# Patient Record
Sex: Male | Born: 1995 | Race: Black or African American | Hispanic: No | Marital: Single | Smoking: Never smoker
Health system: Southern US, Community
[De-identification: ages and names within clinical notes are randomized; demographics above are authoritative.]

---

## 2016-12-03 ENCOUNTER — Emergency Department (HOSPITAL_COMMUNITY): Payer: Medicaid - Out of State

## 2016-12-03 ENCOUNTER — Emergency Department (HOSPITAL_COMMUNITY)
Admission: EM | Admit: 2016-12-03 | Discharge: 2016-12-03 | Disposition: A | Discharge status: Home / Self Care | Payer: Medicaid - Out of State | Attending: Emergency Medicine | Admitting: Emergency Medicine

## 2016-12-03 ENCOUNTER — Encounter (HOSPITAL_COMMUNITY): Payer: Self-pay | Admitting: Neurology

## 2016-12-03 DIAGNOSIS — W109XXA Fall (on) (from) unspecified stairs and steps, initial encounter: Secondary | ICD-10-CM | POA: Insufficient documentation

## 2016-12-03 DIAGNOSIS — Y999 Unspecified external cause status: Secondary | ICD-10-CM | POA: Diagnosis not present

## 2016-12-03 DIAGNOSIS — S6992XA Unspecified injury of left wrist, hand and finger(s), initial encounter: Secondary | ICD-10-CM | POA: Diagnosis present

## 2016-12-03 DIAGNOSIS — Y939 Activity, unspecified: Secondary | ICD-10-CM | POA: Insufficient documentation

## 2016-12-03 DIAGNOSIS — Y929 Unspecified place or not applicable: Secondary | ICD-10-CM | POA: Diagnosis not present

## 2016-12-03 DIAGNOSIS — S62653A Nondisplaced fracture of medial phalanx of left middle finger, initial encounter for closed fracture: Secondary | ICD-10-CM | POA: Diagnosis not present

## 2016-12-03 MED ORDER — NAPROXEN 250 MG PO TABS: 250.0000 mg | ORAL_TABLET | Freq: Two times a day (BID) | ORAL | 0 refills | Status: AC

## 2016-12-03 NOTE — ED Triage Notes (Signed)
Pt reports a few days ago fell down some stairs and jammed his left 3rd finger had some swelling. Swelling has gone down, but still hurts to bend finger.

## 2016-12-03 NOTE — ED Provider Notes (Signed)
[facility]-EMERGENCY DEPT Provider Note   CSN: [id number] Arrival date & time: 12/03/16  1754   By signing my name below, I, Matthew Blake, attest that this documentation has been prepared under the direction and in the presence of Will Dontavious Emily, PA-C Electronically Signed: Soijett Blake, ED Scribe. 12/03/16. 7:33 PM.  History   Chief Complaint Chief Complaint  Patient presents with  . Finger Injury    HPI Matthew Blake is a 20 y.o. male who presents to the Emergency Department complaining of left middle finger injury occurring 1.5 weeks ago. Pt notes that he fell down stairs and jammed his left middle finger 1.5 weeks ago. Pt left middle finger pain is worsened with bending. Pt is right hand dominant. Pt is having associated symptoms of resolved left middle finger swelling. He hasn't tried any medications for the relief of his symptoms. He denies numbness, tingling, weakness, color change, wound, and any other symptoms.    The history is provided by the patient. No language interpreter was used.    History reviewed. No pertinent past medical history.  There are no active problems to display for this patient.   History reviewed. No pertinent surgical history.     Home Medications    Prior to Admission medications   Medication Sig Start Date End Date Taking? Authorizing Provider  naproxen (NAPROSYN) 250 MG tablet Take 1 tablet (250 mg total) by mouth 2 (two) times daily with a meal. 12/03/16   Everlene FarrierWilliam Dorothy Landgrebe, PA-C    Family History No family history on file.  Social History Social History  Substance Use Topics  . Smoking status: Never Smoker  . Smokeless tobacco: Never Used  . Alcohol use Yes     Allergies   Patient has no known allergies.   Review of Systems Review of Systems  Constitutional: Negative for fever.  Musculoskeletal: Positive for arthralgias and joint swelling (resolved). Negative for back pain and neck pain.  Skin: Negative for color change, rash  and wound.  Neurological: Negative for weakness and numbness.       No tingling     Physical Exam Updated Vital Signs BP 151/75 (BP Location: Right Arm)   Pulse 82   Temp 98.1 F (36.7 C) (Oral)   Resp 16   SpO2 97%   Physical Exam  Constitutional: He appears well-developed and well-nourished. No distress.  Non-toxic appearing.  HENT:  Head: Normocephalic and atraumatic.  Eyes: Right eye exhibits no discharge. Left eye exhibits no discharge.  Cardiovascular: Normal rate, regular rhythm and intact distal pulses.   Radial pulses intact bilaterally.   Pulmonary/Chest: Effort normal. No respiratory distress.  Musculoskeletal: Normal range of motion. He exhibits tenderness. He exhibits no edema or deformity.       Right hand: He exhibits tenderness. He exhibits normal range of motion and no swelling. Normal sensation noted. Normal strength noted.  No edema or ecchymosis to left hand. Tenderness to left 3rd PIP joint. Sensation and ROM intact.   Neurological: He is alert. Coordination normal.  Sensation is intact in his bilateral distal fingertips.  Skin: Skin is warm and dry. Capillary refill takes less than 2 seconds. No rash noted. He is not diaphoretic. No erythema. No pallor.  Psychiatric: He has a normal mood and affect. His behavior is normal.  Nursing note and vitals reviewed.    ED Treatments / Results  DIAGNOSTIC STUDIES: Oxygen Saturation is 97% on RA, nl by my interpretation.    COORDINATION OF CARE: 7:29 PM Discussed treatment  plan with pt at bedside which includes left middle finger xray, naprosyn Rx and pt agreed to plan.   Radiology Dg Finger Middle Left  Result Date: 12/03/2016 CLINICAL DATA:  Fall down stairs, pain at PIP joint EXAM: LEFT MIDDLE FINGER 2+V COMPARISON:  None. FINDINGS: Tiny osseous density along the radial base of the middle phalanx, worrisome for avulsion fracture. The joint spaces are preserved. Visualized soft tissues are within normal  limits. IMPRESSION: Possible tiny avulsion fracture along the radial base of the middle phalanx. Correlate for point tenderness. Electronically Signed   By: Charline BillsSriyesh  Krishnan M.D.   On: 12/03/2016 18:50    Procedures Procedures (including critical care time)  Medications Ordered in ED Medications - No data to display   Initial Impression / Assessment and Plan / ED Course  I have reviewed the triage vital signs and the nursing notes.  Pertinent imaging results that were available during my care of the patient were reviewed by me and considered in my medical decision making (see chart for details).  Clinical Course      Pt presents s/p fall 1.5 weeks ago with left finger injury. He denies other injury. He reports initially had some swelling to his finger that has resolved. He still reports pain with range of motion of his finger and with palpation. Patient X-Ray positive for "possible tiny avulsion fracture along the radial base of the middle phalanx. Correlate for point tenderness." Patient does have tenderness to this area. He is neurovascularly intact. We'll place in a finger splint and have him follow-up with orthopedic hand surgery. Will discharge home with naprosyn Rx. Conservative therapy recommended and discussed. Patient will be discharged home & is agreeable with above plan. Returns precautions discussed. Pt appears safe for discharge.  Final Clinical Impressions(s) / ED Diagnoses   Final diagnoses:  Closed nondisplaced fracture of middle phalanx of left middle finger, initial encounter    New Prescriptions New Prescriptions   NAPROXEN (NAPROSYN) 250 MG TABLET    Take 1 tablet (250 mg total) by mouth 2 (two) times daily with a meal.   I personally performed the services described in this documentation, which was scribed in my presence. The recorded information has been reviewed and is accurate.       Everlene FarrierWilliam Zakya Halabi, PA-C 12/03/16 1938    Rolland PorterMark James, MD 12/10/16  [phone]

## 2016-12-03 NOTE — Progress Notes (Signed)
Orthopedic Tech Progress Note Patient Details:  Matthew Blake 06-16-1996 [id number]  Ortho Devices Type of Ortho Device: Finger splint Ortho Device/Splint Location: LUE Ortho Device/Splint Interventions: Ordered, Application   Jennye MoccasinHughes, [name] Craig 12/03/2016, 7:42 PM

## 2017-04-23 IMAGING — DX DG FINGER MIDDLE 2+V*L*
3 series · 3 of 3 positions shown · non-contrast
Comparison: None.

CLINICAL DATA: Fall down stairs, pain at PIP joint

EXAM:
LEFT MIDDLE FINGER 2+V

[finger ap]
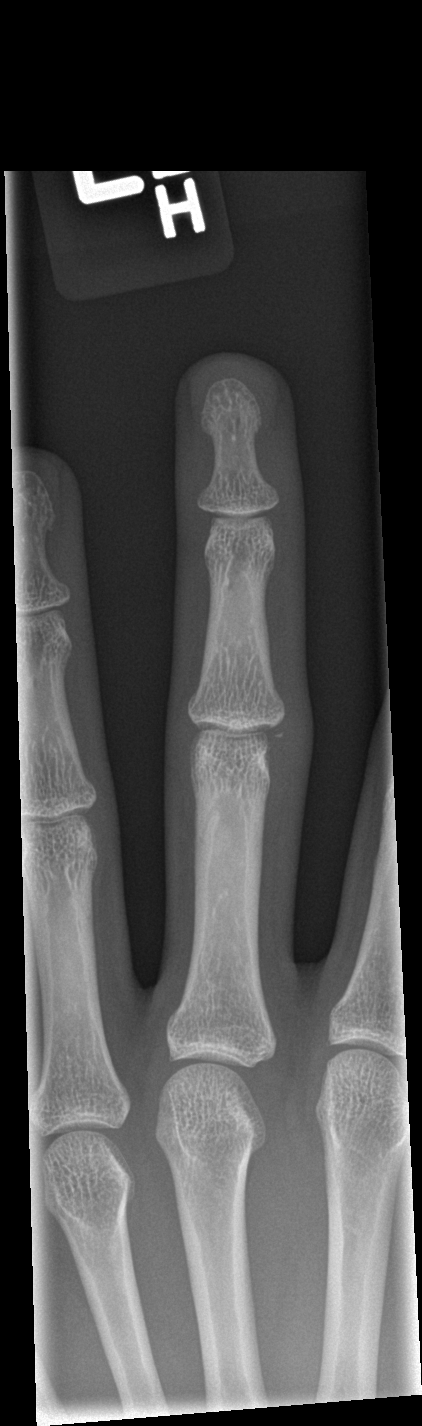

[finger obl]
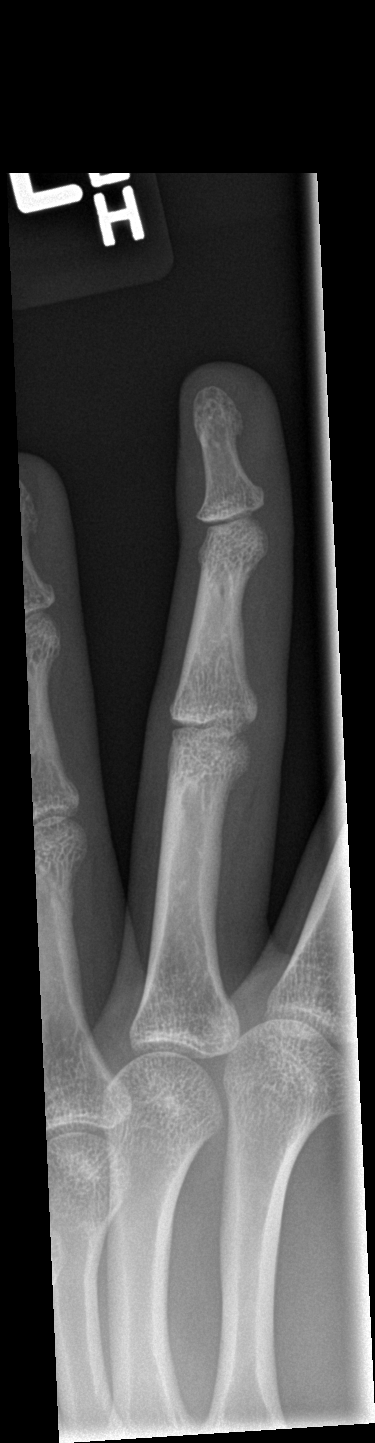

[finger lat]
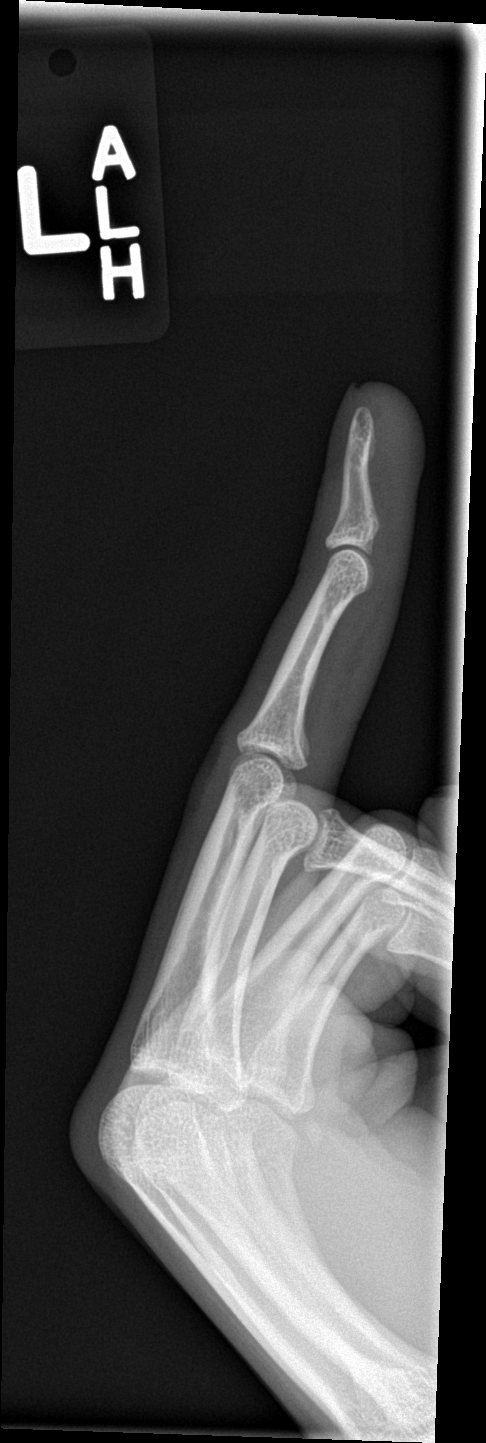

[3 of 3 positions shown; findings below may reference images not displayed]

FINDINGS: Tiny osseous density along the radial base of the middle phalanx,
worrisome for avulsion fracture.

The joint spaces are preserved.

Visualized soft tissues are within normal limits.
IMPRESSION: Possible tiny avulsion fracture along the radial base of the middle
phalanx. Correlate for point tenderness.
